# Patient Record
Sex: Male | Born: 1995 | Race: Black or African American | Hispanic: No | Marital: Single | Smoking: Never smoker
Health system: Southern US, Community
[De-identification: ages and names within clinical notes are randomized; demographics above are authoritative.]

---

## 2020-08-26 ENCOUNTER — Emergency Department (HOSPITAL_COMMUNITY)
Admission: EM | Admit: 2020-08-26 | Discharge: 2020-08-27 | Disposition: A | Discharge status: Home / Self Care | Payer: 59 | Attending: Emergency Medicine | Admitting: Emergency Medicine

## 2020-08-26 ENCOUNTER — Other Ambulatory Visit: Payer: Self-pay

## 2020-08-26 ENCOUNTER — Encounter (HOSPITAL_COMMUNITY): Payer: Self-pay

## 2020-08-26 DIAGNOSIS — M778 Other enthesopathies, not elsewhere classified: Secondary | ICD-10-CM

## 2020-08-26 DIAGNOSIS — M25531 Pain in right wrist: Secondary | ICD-10-CM | POA: Diagnosis present

## 2020-08-26 DIAGNOSIS — R030 Elevated blood-pressure reading, without diagnosis of hypertension: Secondary | ICD-10-CM | POA: Insufficient documentation

## 2020-08-26 DIAGNOSIS — M65241 Calcific tendinitis, right hand: Secondary | ICD-10-CM | POA: Insufficient documentation

## 2020-08-26 NOTE — ED Triage Notes (Signed)
Pt reports right hand/ wrist pain after boxing for a week or so. Pt sts hitting the bag with wrong part of hand.

## 2020-08-27 ENCOUNTER — Emergency Department (HOSPITAL_COMMUNITY): Payer: 59

## 2020-08-27 NOTE — Discharge Instructions (Addendum)
Apply ice for 30 minutes at a time, 4 times a day.  Take over-the-counter anti-inflammatory medication like ibuprofen or naproxen.  Ibuprofen does would be 3 tablets at a time, 4 times a day.  Naproxen dose would be 2 tablets at a time, twice a day.  You may take acetaminophen for additional pain relief as needed.  Wear the brace as needed.  Stop doing boxing training with your right hand until it has healed.  Then, restart at lower intensity and shorter duration of work.  You will need to build your work-up time and intensity up gradually.

## 2020-08-27 NOTE — ED Provider Notes (Signed)
[facility]-EMERGENCY DEPT Provider Note   CSN: [id number] Arrival date & time: 08/26/20  2335   History Chief Complaint  Patient presents with  . Wrist Pain    Danny Harding is a 25 y.o. male.  The history is provided by the patient.  Wrist Pain  He complains of pain in his right wrist which she has noted over the last several days.  He started doing boxing workouts about 2 weeks ago and spends about 45 minutes punching a bag.  His trainer suggested that he come to the ED to be evaluated.  History reviewed. No pertinent past medical history.  There are no problems to display for this patient.   History reviewed. No pertinent surgical history.     No family history on file.  Social History   Tobacco Use  . Smoking status: Never Smoker  . Smokeless tobacco: Never Used  Substance Use Topics  . Alcohol use: Not Currently  . Drug use: Not Currently    Home Medications Prior to Admission medications   Not on File    Allergies    Patient has no known allergies.  Review of Systems   Review of Systems  All other systems reviewed and are negative.   Physical Exam Updated Vital Signs BP (!) 133/98 (BP Location: Left Arm)   Pulse 66   Temp 98.3 F (36.8 C) (Oral)   Resp 18   Ht 5\' 10"  (1.778 m)   Wt 68 kg   SpO2 100%   BMI 21.52 kg/m   Physical Exam Vitals and nursing note reviewed.   25 year old male, resting comfortably and in no acute distress. Vital signs are significant for elevated blood pressure. Oxygen saturation is 100%, which is normal. Head is normocephalic and atraumatic. PERRLA, EOMI. Oropharynx is clear. Neck is nontender and supple without adenopathy or JVD. Back is nontender and there is no CVA tenderness. Lungs are clear without rales, wheezes, or rhonchi. Chest is nontender. Heart has regular rate and rhythm without murmur. Abdomen is soft, flat, nontender without masses or hepatosplenomegaly and peristalsis is  normoactive. Extremities: There is slight soft tissue swelling over the dorsum of the right hand on the ulnar side.  There is tenderness to palpation over the extensor tendons for the fourth and fifth fingers.  There is pain elicited with passive flexion of the fingers and with active extension against resistance.  There is no warmth or erythema.  Distal neurovascular exam is intact with prompt capillary refill and normal sensation. Skin is warm and dry without rash. Neurologic: Mental status is normal, cranial nerves are intact, there are no motor or sensory deficits.  ED Results / Procedures / Treatments    Radiology DG Wrist Complete Right  Result Date: 08/27/2020 CLINICAL DATA:  Boxing injury. Pain in the fourth and fifth metacarpals. EXAM: RIGHT WRIST - COMPLETE 3+ VIEW COMPARISON:  None. FINDINGS: There is no evidence of fracture or dislocation. There is no evidence of arthropathy or other focal bone abnormality. Soft tissues are unremarkable. IMPRESSION: Negative. Electronically Signed   By: 08/29/2020 M.D.   On: 08/27/2020 00:40   DG Hand Complete Right  Result Date: 08/27/2020 CLINICAL DATA:  Boxing injury with pain in the fourth and fifth metacarpals. EXAM: RIGHT HAND - COMPLETE 3+ VIEW COMPARISON:  None. FINDINGS: There is no evidence of fracture or dislocation. There is no evidence of arthropathy or other focal bone abnormality. Soft tissues are unremarkable. IMPRESSION: Negative. Electronically Signed  By: Deatra Robinson M.D.   On: 08/27/2020 00:41    Procedures Procedures   Medications Ordered in ED Medications - No data to display  ED Course  I have reviewed the triage vital signs and the nursing notes.  Pertinent imaging results that were available during my care of the patient were reviewed by me and considered in my medical decision making (see chart for details).  MDM Rules/Calculators/A&P Tendinitis of the extensor tendons of the right fourth and fifth fingers  secondary to overuse.  X-rays were obtained which showed no evidence of fracture or dislocation.  He is placed in a wrist brace and advised on ice and elevation, told to use over-the-counter NSAIDs as anti-inflammatory medication.  Recommended restarting boxing workouts at a much lower intensity and duration once his hand has healed.  He has no prior records in the [facility] health system or in Care Everywhere.  Final Clinical Impression(s) / ED Diagnoses Final diagnoses:  Tendonitis of right hand  Elevated blood pressure reading without diagnosis of hypertension    Rx / DC Orders ED Discharge Orders    None       Dione Booze, MD 08/27/20 [phone]

## 2022-03-14 IMAGING — CR DG WRIST COMPLETE 3+V*R*
4 series · 4 of 4 positions shown · non-contrast
Comparison: None.

CLINICAL DATA: Boxing injury. Pain in the fourth and fifth
metacarpals.

EXAM:
RIGHT WRIST - COMPLETE 3+ VIEW

[x wrist pa right]
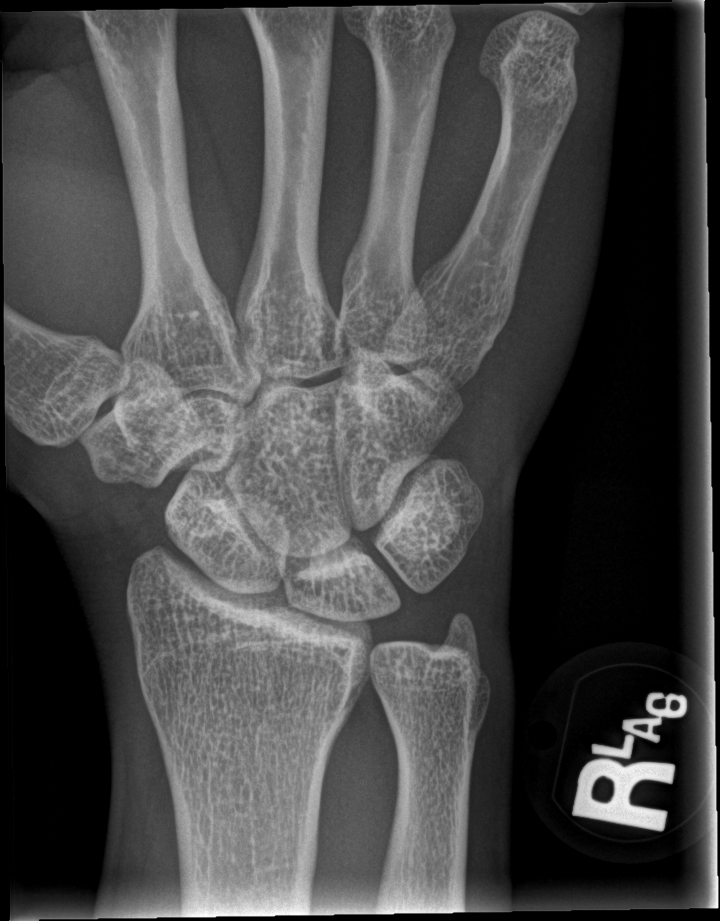

[x wrist navicular view right]
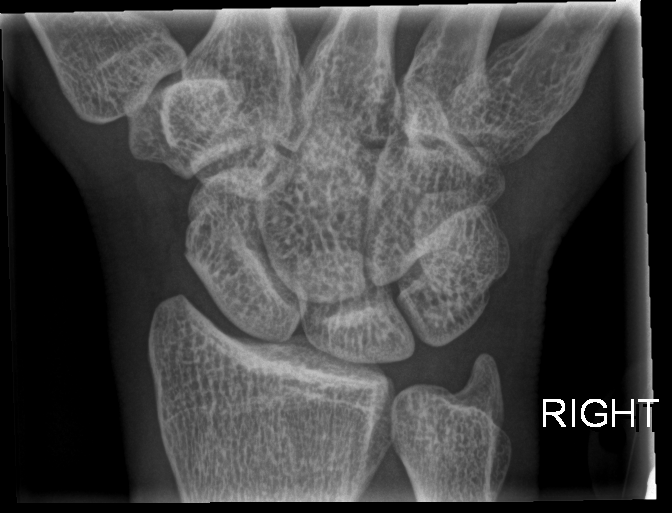

[x wrist obl right]
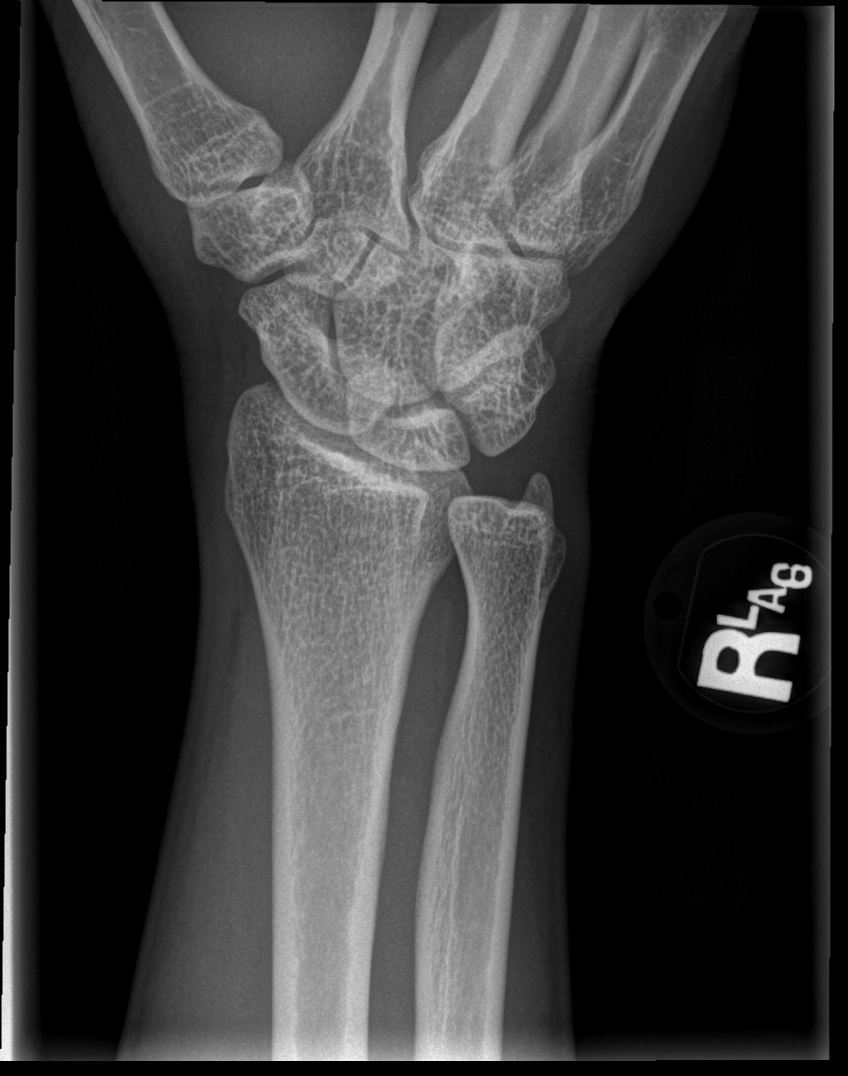

[x wrist lat right]
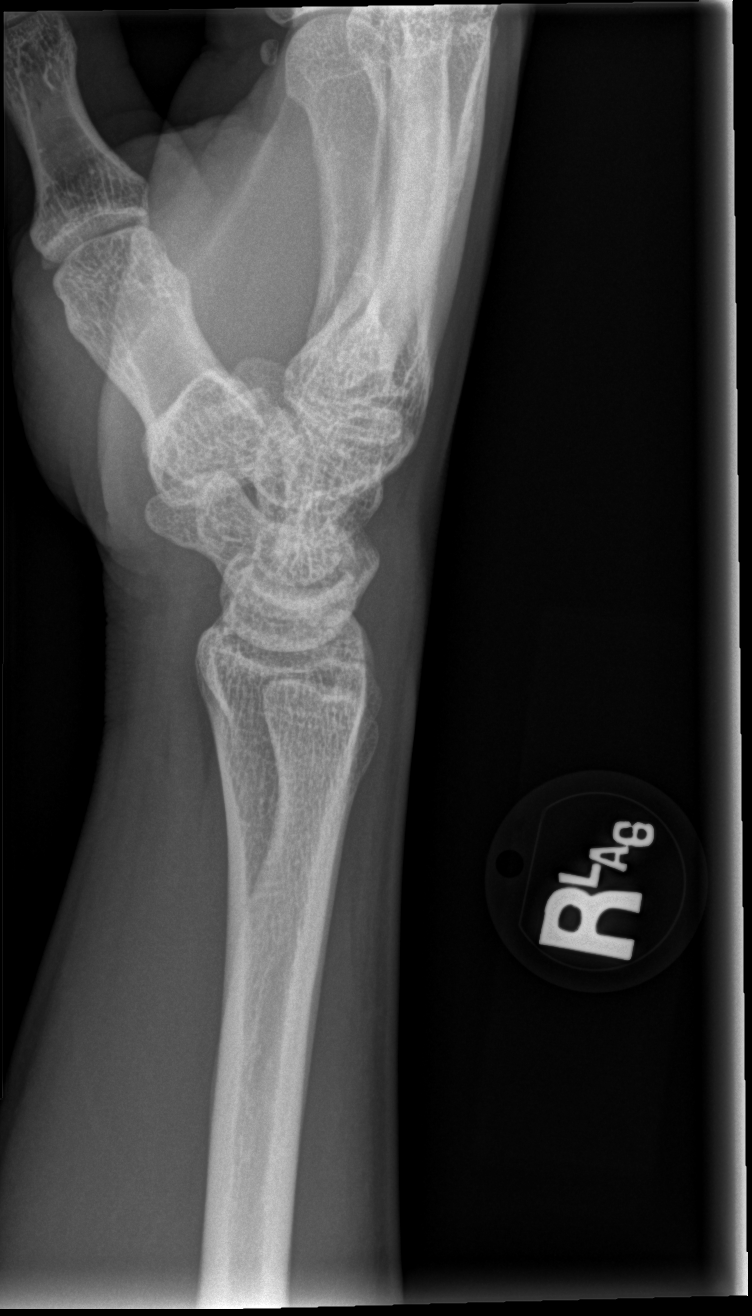

[4 of 4 positions shown; findings below may reference images not displayed]

FINDINGS: There is no evidence of fracture or dislocation. There is no
evidence of arthropathy or other focal bone abnormality. Soft
tissues are unremarkable.
IMPRESSION: Negative.
